# Patient Record
Sex: Female | Born: 1985 | Race: Black or African American | Hispanic: No | Marital: Single | Smoking: Current some day smoker
Health system: Southern US, Community
[De-identification: ages and names within clinical notes are randomized; demographics above are authoritative.]

## PROBLEM LIST (undated history)

## (undated) HISTORY — PX: EYE SURGERY: SHX253

---

## 2019-04-14 ENCOUNTER — Emergency Department (INDEPENDENT_AMBULATORY_CARE_PROVIDER_SITE_OTHER): Payer: BC Managed Care – PPO

## 2019-04-14 ENCOUNTER — Other Ambulatory Visit: Payer: Self-pay

## 2019-04-14 ENCOUNTER — Encounter: Payer: Self-pay | Admitting: Emergency Medicine

## 2019-04-14 ENCOUNTER — Emergency Department
Admission: EM | Admit: 2019-04-14 | Discharge: 2019-04-14 | Disposition: A | Discharge status: Home / Self Care | Payer: BC Managed Care – PPO | Source: Home / Self Care | Attending: Family Medicine | Admitting: Family Medicine

## 2019-04-14 DIAGNOSIS — L03011 Cellulitis of right finger: Secondary | ICD-10-CM

## 2019-04-14 DIAGNOSIS — M79644 Pain in right finger(s): Secondary | ICD-10-CM

## 2019-04-14 MED ORDER — DOXYCYCLINE HYCLATE 100 MG PO CAPS: 100.0000 mg | ORAL_CAPSULE | Freq: Two times a day (BID) | ORAL | 0 refills | Status: AC

## 2019-04-14 NOTE — ED Provider Notes (Signed)
[facility]    CSN: [id number] Arrival date & time: 04/14/19  2841      History   Chief Complaint Chief Complaint  Patient presents with  . thumb injury    HPI Zoe Allen is a 33 y.o. female.   While cleaning a hotel trash can 2 days ago, a fiber from the edge of the can punctured her distal right thumb tip.  She has had persistent tenderness at the site. Last Tdap was 11/24/15.   Hand Pain This is a new problem. The current episode started 2 days ago. The problem occurs constantly. The problem has been gradually worsening. Exacerbated by: contact. Nothing relieves the symptoms. She has tried nothing for the symptoms.    History reviewed. No pertinent past medical history.  There are no active problems to display for this patient.   Past Surgical History:  Procedure Laterality Date  . EYE SURGERY      OB History   No obstetric history on file.      Home Medications    Prior to Admission medications   Medication Sig Start Date End Date Taking? Authorizing Provider  doxycycline (VIBRAMYCIN) 100 MG capsule Take 1 capsule (100 mg total) by mouth 2 (two) times daily. Take with food. 04/14/19   Kandra Nicolas, MD    Family History Family History  Problem Relation Age of Onset  . Rheum arthritis Mother   . Hypertension Mother     Social History Social History   Tobacco Use  . Smoking status: Current Some Day Smoker  . Smokeless tobacco: Never Used  Substance Use Topics  . Alcohol use: Yes  . Drug use: Not on file     Allergies   Patient has no known allergies.   Review of Systems Review of Systems  Constitutional: Negative for diaphoresis, fatigue and fever.  Musculoskeletal:       Pain in right fingertip     Physical Exam Triage Vital Signs ED Triage Vitals  Enc Vitals Group     BP 04/14/19 1029 131/88     Pulse Rate 04/14/19 1029 64     Resp --      Temp 04/14/19 1029 97.6 F (36.4 C)     Temp Source 04/14/19 1029  Oral     SpO2 04/14/19 1029 100 %     Weight 04/14/19 1030 175 lb (79.4 kg)     Height 04/14/19 1030 5\' 5"  (1.651 m)     Head Circumference --      Peak Flow --      Pain Score 04/14/19 1029 1     Pain Loc --      Pain Edu? --      Excl. in [place]? --    No data found.  Updated Vital Signs BP 131/88 (BP Location: Right Arm)   Pulse 64   Temp 97.6 F (36.4 C) (Oral)   Ht 5\' 5"  (1.651 m)   Wt 79.4 kg   LMP 03/25/2019 (Approximate)   SpO2 100%   BMI 29.12 kg/m   Visual Acuity Right Eye Distance:   Left Eye Distance:   Bilateral Distance:    Right Eye Near:   Left Eye Near:    Bilateral Near:     Physical Exam Constitutional:      General: She is not in acute distress. HENT:     Head: Atraumatic.     Nose: Nose normal.  Eyes:     Pupils: Pupils are equal,  round, and reactive to light.  Cardiovascular:     Rate and Rhythm: Normal rate.  Pulmonary:     Effort: Pulmonary effort is normal.  Musculoskeletal:       Hands:     Comments: RIght thumb has tiny puncture wound palmar surface distal phalanx; mild tenderness to palpation.  Thumb has full range of motion.  Skin:    General: Skin is warm and dry.  Neurological:     Mental Status: She is alert.      UC Treatments / Results  Labs (all labs ordered are listed, but only abnormal results are displayed) Labs Reviewed - No data to display  EKG   Radiology Dg Finger Thumb Right  Result Date: 04/14/2019 CLINICAL DATA:  Question of foreign body distal right thumb. EXAM: RIGHT THUMB 2+V COMPARISON:  None FINDINGS: There is no evidence of fracture or dislocation. There is no evidence of arthropathy or other focal bone abnormality. Soft tissues are unremarkable, specifically no evidence of radiopaque foreign body. IMPRESSION: Negative. Electronically Signed   By: Donzetta Kohut M.D.   On: 04/14/2019 11:20    Procedures Procedures (including critical care time)  Medications Ordered in UC Medications - No data to  display  Initial Impression / Assessment and Plan / UC Course  I have reviewed the triage vital signs and the nursing notes.  Pertinent labs & imaging results that were available during my care of the patient were reviewed by me and considered in my medical decision making (see chart for details).    Begin empiric doxycycline 100mg  BID. Followup here or with Family Doctor if not improved in one week.    Final Clinical Impressions(s) / UC Diagnoses   Final diagnoses:  Cellulitis of right thumb     Discharge Instructions      Apply Bacitracin ointment and bandage to wound daily.  Keep wound clean and dry.  Return for any signs of infection (or follow-up with family doctor):  Increasing redness, swelling, pain, heat, drainage, etc.       ED Prescriptions    Medication Sig Dispense Auth. Provider   doxycycline (VIBRAMYCIN) 100 MG capsule Take 1 capsule (100 mg total) by mouth 2 (two) times daily. Take with food. 10 capsule Marland Kitchen, MD        Lattie Haw, MD 04/17/19 [phone]

## 2019-04-14 NOTE — ED Triage Notes (Signed)
RT thumb injury x 3 days ago cleaning a metal wastebasket and punctured thumb

## 2019-04-14 NOTE — Discharge Instructions (Addendum)
Apply Bacitracin ointment and bandage to wound daily.Marland Kitchen  Keep wound clean and dry.  Return for any signs of infection (or follow-up with family doctor):  Increasing redness, swelling, pain, heat, drainage, etc.

## 2020-06-06 IMAGING — DX DG FINGER THUMB 2+V*R*
3 series · 3 of 3 positions shown · non-contrast
Comparison: None

CLINICAL DATA: Question of foreign body distal right thumb.

EXAM:
RIGHT THUMB 2+V

[finger ap]
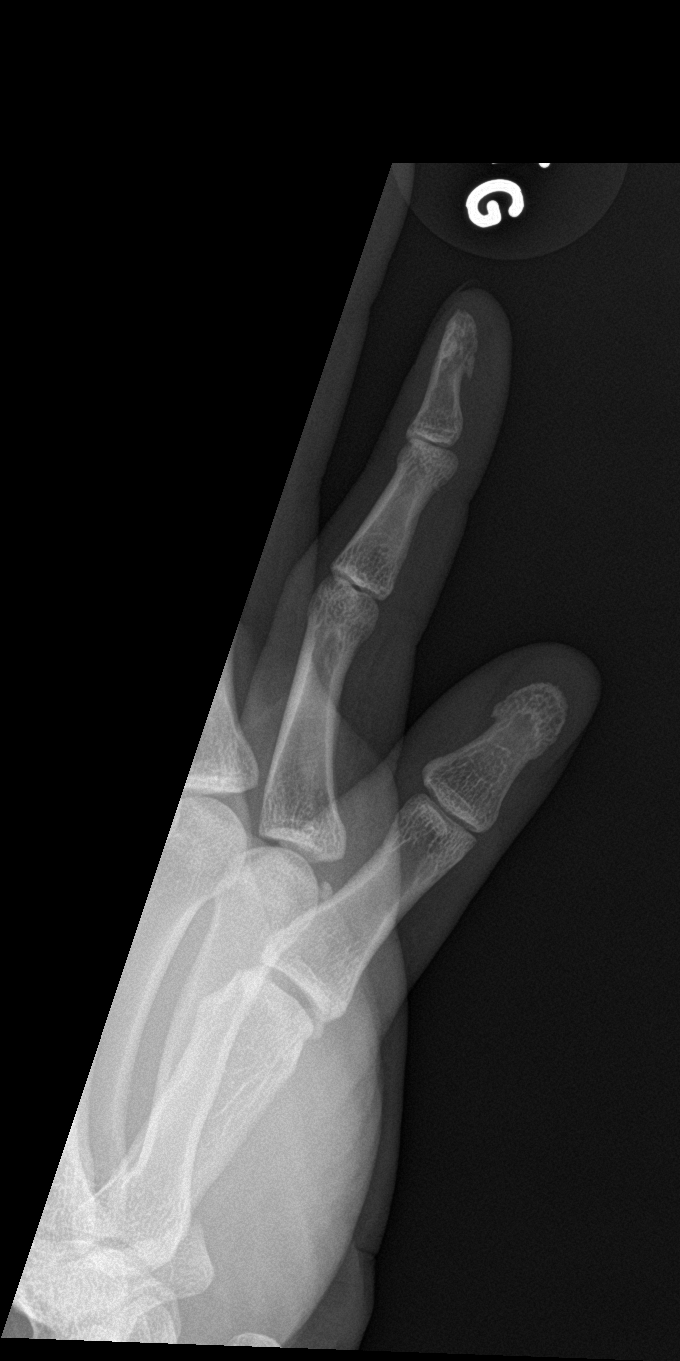

[finger obl]
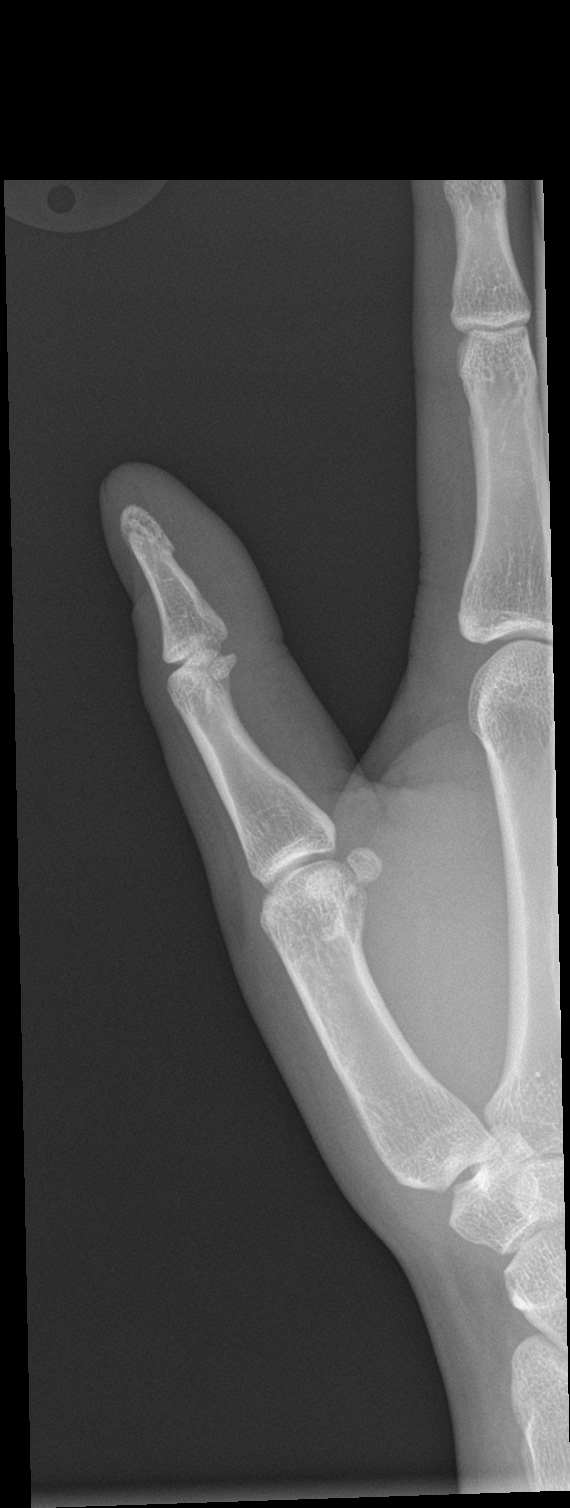

[finger lat]
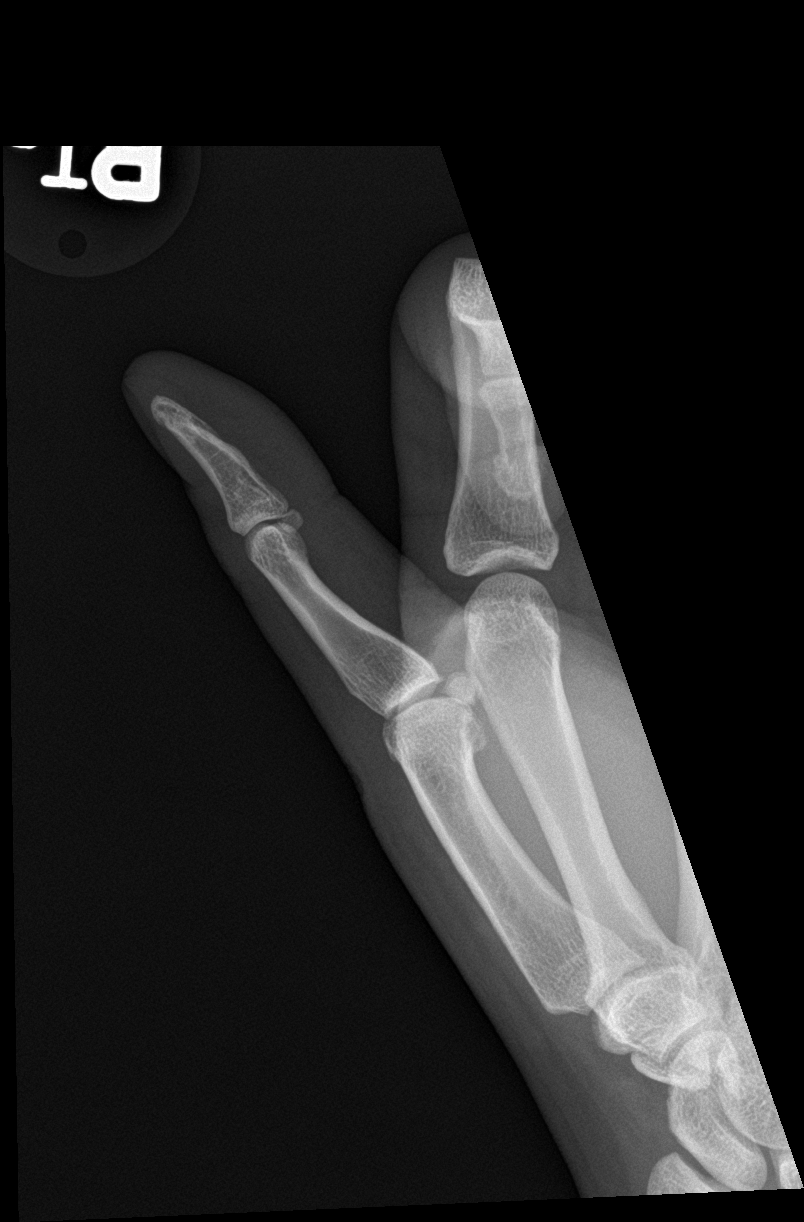

[3 of 3 positions shown; findings below may reference images not displayed]

FINDINGS: There is no evidence of fracture or dislocation. There is no
evidence of arthropathy or other focal bone abnormality. Soft
tissues are unremarkable, specifically no evidence of radiopaque
foreign body.
IMPRESSION: Negative.
